# Patient Record
Sex: Male | Born: 1970
Health system: Southern US, Community
[De-identification: ages and names within clinical notes are randomized; demographics above are authoritative.]

## PROBLEM LIST (undated history)

## (undated) DIAGNOSIS — I10 Essential (primary) hypertension: Secondary | ICD-10-CM

## (undated) DIAGNOSIS — G43909 Migraine, unspecified, not intractable, without status migrainosus: Secondary | ICD-10-CM

## (undated) DIAGNOSIS — N289 Disorder of kidney and ureter, unspecified: Secondary | ICD-10-CM

---

## 2020-06-23 ENCOUNTER — Other Ambulatory Visit: Payer: Self-pay

## 2020-06-23 ENCOUNTER — Ambulatory Visit
Admission: EM | Admit: 2020-06-23 | Discharge: 2020-06-23 | Disposition: A | Discharge status: Home / Self Care | Payer: BLUE CROSS/BLUE SHIELD | Attending: Emergency Medicine | Admitting: Emergency Medicine

## 2020-06-23 ENCOUNTER — Ambulatory Visit: Admit: 2020-06-23 | Discharge status: Absent without leave | Payer: Self-pay

## 2020-06-23 DIAGNOSIS — R0981 Nasal congestion: Secondary | ICD-10-CM | POA: Diagnosis not present

## 2020-06-23 MED ORDER — PREDNISONE 10 MG (21) PO TBPK: ORAL_TABLET | Freq: Every day | ORAL | 0 refills | Status: DC

## 2020-06-23 MED ORDER — AMOXICILLIN-POT CLAVULANATE 875-125 MG PO TABS: 1.0000 | ORAL_TABLET | Freq: Two times a day (BID) | ORAL | 0 refills | Status: DC

## 2020-06-23 MED ORDER — FLUTICASONE PROPIONATE 50 MCG/ACT NA SUSP: 1.0000 | Freq: Every day | NASAL | 0 refills | Status: DC

## 2020-06-23 NOTE — ED Provider Notes (Signed)
[facility]    CSN: [id number] Arrival date & time: 06/23/20  0941      History   Chief Complaint Chief Complaint  Patient presents with   Facial Pain    x 3 days    HPI Jack Singleton is a 49 y.o. male.   Jack Singleton presents with complaints of sinus pressure and mild congestion which started a few days ago. No shortness of breath  Or cough. Mild sore throat. States he tends to get a sinus infection every year which feels similar. No fevers. No gi symptoms. No dizziness.    ROS per HPI, negative if not otherwise mentioned.      History reviewed. No pertinent past medical history.  There are no problems to display for this patient.   History reviewed. No pertinent surgical history.     Home Medications    Prior to Admission medications   Medication Sig Start Date End Date Taking? Authorizing Provider  amoxicillin-clavulanate (AUGMENTIN) 875-125 MG tablet Take 1 tablet by mouth every 12 (twelve) hours. 06/27/20   Zigmund Gottron, NP  fluticasone (FLONASE) 50 MCG/ACT nasal spray Place 1 spray into both nostrils daily. 06/23/20   Zigmund Gottron, NP  predniSONE (STERAPRED UNI-PAK 21 TAB) 10 MG (21) TBPK tablet Take by mouth daily. Per box instruction 06/23/20   Zigmund Gottron, NP    Family History History reviewed. No pertinent family history.  Social History Social History   Tobacco Use   Smoking status: Never Smoker   Smokeless tobacco: Never Used  Scientific laboratory technician Use: Never used  Substance Use Topics   Alcohol use: Yes   Drug use: Never     Allergies   Patient has no known allergies.   Review of Systems Review of Systems   Physical Exam Triage Vital Signs ED Triage Vitals  Enc Vitals Group     BP 06/23/20 0952 (!) 185/133     Pulse Rate 06/23/20 0952 82     Resp 06/23/20 0952 17     Temp 06/23/20 0952 98.2 F (36.8 C)     Temp Source 06/23/20 0952 Oral     SpO2 06/23/20 0952 99 %     Weight --       Height --      Head Circumference --      Peak Flow --      Pain Score 06/23/20 0954 4     Pain Loc --      Pain Edu? --      Excl. in [place]? --    No data found.  Updated Vital Signs BP (!) 185/133 (BP Location: Left Arm)    Pulse 82    Temp 98.2 F (36.8 C) (Oral)    Resp 17    SpO2 99%   Visual Acuity Right Eye Distance:   Left Eye Distance:   Bilateral Distance:    Right Eye Near:   Left Eye Near:    Bilateral Near:     Physical Exam Constitutional:      Appearance: He is well-developed.  HENT:     Right Ear: Tympanic membrane and ear canal normal.     Left Ear: Tympanic membrane and ear canal normal.     Nose:     Right Sinus: Maxillary sinus tenderness present. No frontal sinus tenderness.     Left Sinus: Maxillary sinus tenderness present. No frontal sinus tenderness.  Cardiovascular:     Rate and Rhythm: Normal  rate.  Pulmonary:     Effort: Pulmonary effort is normal.  Skin:    General: Skin is warm and dry.  Neurological:     Mental Status: He is alert and oriented to person, place, and time.      UC Treatments / Results  Labs (all labs ordered are listed, but only abnormal results are displayed) Labs Reviewed - No data to display  EKG   Radiology No results found.  Procedures Procedures (including critical care time)  Medications Ordered in UC Medications - No data to display  Initial Impression / Assessment and Plan / UC Course  I have reviewed the triage vital signs and the nursing notes.  Pertinent labs & imaging results that were available during my care of the patient were reviewed by me and considered in my medical decision making (see chart for details).     Sinusitis, <10days. Prednisone provided, as it has provided relief in the past. Have and hold augmentin, to start this weekend if worsening of symptoms. Return precautions provided. Patient verbalized understanding and agreeable to plan.   Final Clinical Impressions(s) / UC  Diagnoses   Final diagnoses:  Sinus congestion     Discharge Instructions     Push fluids to ensure adequate hydration and keep secretions thin.  Tylenol and/or ibuprofen as needed for pain or fevers.  Mucinex as an expectorant or hypertensive safe medications as needed (your blood pressure is elevated today) Course of prednisone as provided.  If by this weekend if no improvement or if worsening may complete antibiotics, but I would wait to take this and not use if any improvement of symptoms as this is likely viral in nature.  If symptoms worsen or do not improve in the next week to return to be seen or to follow up with your PCP- follow up for recheck of your blood pressure.     ED Prescriptions    Medication Sig Dispense Auth. Provider   fluticasone (FLONASE) 50 MCG/ACT nasal spray Place 1 spray into both nostrils daily. 16 g Augusto Gamble B, NP   predniSONE (STERAPRED UNI-PAK 21 TAB) 10 MG (21) TBPK tablet Take by mouth daily. Per box instruction 21 tablet Augusto Gamble B, NP   amoxicillin-clavulanate (AUGMENTIN) 875-125 MG tablet Take 1 tablet by mouth every 12 (twelve) hours. 14 tablet Zigmund Gottron, NP     PDMP not reviewed this encounter.   Zigmund Gottron, NP 06/23/20 1253

## 2020-06-23 NOTE — ED Triage Notes (Signed)
Pt states he has had sinus pain/pressure behind hisa eyes and head. Pt states he has this every year. Pt states no fever of other symptoms besides mild congestion. Pt is aox4 and ambulatory.

## 2020-06-23 NOTE — ED Notes (Signed)
Pt states his BP is always high at the dr so it will still be high and he doesn't want it rechecked.

## 2020-06-23 NOTE — Discharge Instructions (Addendum)
Push fluids to ensure adequate hydration and keep secretions thin.  Tylenol and/or ibuprofen as needed for pain or fevers.  Mucinex as an expectorant or hypertensive safe medications as needed (your blood pressure is elevated today) Course of prednisone as provided.  If by this weekend if no improvement or if worsening may complete antibiotics, but I would wait to take this and not use if any improvement of symptoms as this is likely viral in nature.  If symptoms worsen or do not improve in the next week to return to be seen or to follow up with your PCP- follow up for recheck of your blood pressure.

## 2020-10-09 ENCOUNTER — Emergency Department (HOSPITAL_COMMUNITY): Payer: BLUE CROSS/BLUE SHIELD

## 2020-10-09 ENCOUNTER — Other Ambulatory Visit: Payer: Self-pay

## 2020-10-09 ENCOUNTER — Observation Stay (HOSPITAL_COMMUNITY)
Admission: EM | Admit: 2020-10-09 | Discharge: 2020-10-10 | Disposition: A | Discharge status: Home / Self Care | Payer: BLUE CROSS/BLUE SHIELD | Attending: Student in an Organized Health Care Education/Training Program | Admitting: Student in an Organized Health Care Education/Training Program

## 2020-10-09 ENCOUNTER — Encounter (HOSPITAL_COMMUNITY): Payer: Self-pay

## 2020-10-09 DIAGNOSIS — Z7982 Long term (current) use of aspirin: Secondary | ICD-10-CM | POA: Diagnosis not present

## 2020-10-09 DIAGNOSIS — E669 Obesity, unspecified: Secondary | ICD-10-CM | POA: Diagnosis not present

## 2020-10-09 DIAGNOSIS — I10 Essential (primary) hypertension: Secondary | ICD-10-CM | POA: Diagnosis present

## 2020-10-09 DIAGNOSIS — I159 Secondary hypertension, unspecified: Secondary | ICD-10-CM

## 2020-10-09 DIAGNOSIS — I120 Hypertensive chronic kidney disease with stage 5 chronic kidney disease or end stage renal disease: Principal | ICD-10-CM | POA: Insufficient documentation

## 2020-10-09 DIAGNOSIS — N186 End stage renal disease: Secondary | ICD-10-CM | POA: Insufficient documentation

## 2020-10-09 DIAGNOSIS — Z20822 Contact with and (suspected) exposure to covid-19: Secondary | ICD-10-CM | POA: Insufficient documentation

## 2020-10-09 DIAGNOSIS — Z79899 Other long term (current) drug therapy: Secondary | ICD-10-CM | POA: Diagnosis not present

## 2020-10-09 DIAGNOSIS — N183 Chronic kidney disease, stage 3 unspecified: Secondary | ICD-10-CM | POA: Diagnosis not present

## 2020-10-09 DIAGNOSIS — R531 Weakness: Secondary | ICD-10-CM | POA: Diagnosis present

## 2020-10-09 DIAGNOSIS — G43909 Migraine, unspecified, not intractable, without status migrainosus: Secondary | ICD-10-CM | POA: Diagnosis present

## 2020-10-09 DIAGNOSIS — G43809 Other migraine, not intractable, without status migrainosus: Secondary | ICD-10-CM | POA: Diagnosis not present

## 2020-10-09 HISTORY — DX: Migraine, unspecified, not intractable, without status migrainosus: G43.909

## 2020-10-09 HISTORY — DX: Essential (primary) hypertension: I10

## 2020-10-09 HISTORY — DX: Disorder of kidney and ureter, unspecified: N28.9

## 2020-10-09 LAB — CBC
HCT: 46.6 % (ref 39.0–52.0)
HCT: 45.6 % (ref 39.0–52.0)
Hemoglobin: 15.8 g/dL (ref 13.0–17.0)
Hemoglobin: 15.4 g/dL (ref 13.0–17.0)
MCH: 33.3 pg (ref 26.0–34.0)
MCH: 33.3 pg (ref 26.0–34.0)
MCHC: 33.9 g/dL (ref 30.0–36.0)
MCHC: 33.8 g/dL (ref 30.0–36.0)
MCV: 98.3 fL (ref 80.0–100.0)
MCV: 98.7 fL (ref 80.0–100.0)
Platelets: 384 10*3/uL (ref 150–400)
Platelets: 347 10*3/uL (ref 150–400)
RBC: 4.74 MIL/uL (ref 4.22–5.81)
RBC: 4.62 MIL/uL (ref 4.22–5.81)
RDW: 12 % (ref 11.5–15.5)
RDW: 12.1 % (ref 11.5–15.5)
WBC: 7.6 10*3/uL (ref 4.0–10.5)
WBC: 8.2 10*3/uL (ref 4.0–10.5)
nRBC: 0 % (ref 0.0–0.2)
nRBC: 0 % (ref 0.0–0.2)

## 2020-10-09 LAB — PROTIME-INR
INR: 1 (ref 0.8–1.2)
Prothrombin Time: 12.9 seconds (ref 11.4–15.2)

## 2020-10-09 LAB — BASIC METABOLIC PANEL
Anion gap: 6 (ref 5–15)
BUN: 18 mg/dL (ref 6–20)
CO2: 29 mmol/L (ref 22–32)
Calcium: 9.5 mg/dL (ref 8.9–10.3)
Chloride: 103 mmol/L (ref 98–111)
Creatinine, Ser: 2.01 mg/dL — ABNORMAL HIGH (ref 0.61–1.24)
GFR, Estimated: 40 mL/min — ABNORMAL LOW (ref >60)
Glucose, Bld: 113 mg/dL — ABNORMAL HIGH (ref 70–99)
Potassium: 3.8 mmol/L (ref 3.5–5.1)
Sodium: 138 mmol/L (ref 135–145)

## 2020-10-09 LAB — RESP PANEL BY RT-PCR (FLU A&B, COVID) ARPGX2
Influenza A by PCR: NEGATIVE
Influenza B by PCR: NEGATIVE
SARS Coronavirus 2 by RT PCR: NEGATIVE

## 2020-10-09 LAB — TROPONIN I (HIGH SENSITIVITY)
Troponin I (High Sensitivity): 14 ng/L (ref <18)
Troponin I (High Sensitivity): 13 ng/L (ref <18)

## 2020-10-09 LAB — CREATININE, SERUM
Creatinine, Ser: 1.84 mg/dL — ABNORMAL HIGH (ref 0.61–1.24)
GFR, Estimated: 44 mL/min — ABNORMAL LOW (ref >60)

## 2020-10-09 LAB — HIV ANTIBODY (ROUTINE TESTING W REFLEX): HIV Screen 4th Generation wRfx: NONREACTIVE

## 2020-10-09 MED ORDER — LABETALOL HCL 5 MG/ML IV SOLN
10.0000 mg | Freq: Once | INTRAVENOUS | Status: AC
  Administered 2020-10-09: 10 mg via INTRAVENOUS
  Filled 2020-10-09: qty 4

## 2020-10-09 MED ORDER — SODIUM CHLORIDE 0.9 % IV BOLUS
1000.0000 mL | Freq: Once | INTRAVENOUS | Status: AC
  Administered 2020-10-09: 1000 mL via INTRAVENOUS

## 2020-10-09 MED ORDER — LORAZEPAM 2 MG/ML IJ SOLN
0.5000 mg | Freq: Once | INTRAMUSCULAR | Status: DC
  Filled 2020-10-09: qty 1

## 2020-10-09 MED ORDER — HYDROCHLOROTHIAZIDE 25 MG PO TABS
25.0000 mg | ORAL_TABLET | Freq: Every day | ORAL | Status: DC
  Administered 2020-10-10: 25 mg via ORAL
  Filled 2020-10-09: qty 1

## 2020-10-09 MED ORDER — ACETAMINOPHEN 325 MG PO TABS
650.0000 mg | ORAL_TABLET | Freq: Four times a day (QID) | ORAL | Status: DC | PRN
  Administered 2020-10-09: 650 mg via ORAL
  Filled 2020-10-09: qty 2

## 2020-10-09 MED ORDER — AMLODIPINE BESYLATE 10 MG PO TABS
10.0000 mg | ORAL_TABLET | Freq: Every day | ORAL | Status: DC
  Administered 2020-10-09 – 2020-10-10 (×2): 10 mg via ORAL
  Filled 2020-10-09: qty 2
  Filled 2020-10-09: qty 1

## 2020-10-09 MED ORDER — ENOXAPARIN SODIUM 40 MG/0.4ML ~~LOC~~ SOLN
40.0000 mg | SUBCUTANEOUS | Status: DC
  Administered 2020-10-09: 40 mg via SUBCUTANEOUS
  Filled 2020-10-09: qty 0.4

## 2020-10-09 MED ORDER — AMLODIPINE BESYLATE 5 MG PO TABS
10.0000 mg | ORAL_TABLET | Freq: Once | ORAL | Status: DC
  Filled 2020-10-09: qty 2

## 2020-10-09 NOTE — ED Provider Notes (Signed)
[facility] Provider Note   CSN: [id number] Arrival date & time: 10/09/20  1115     History Chief Complaint  Patient presents with  . Hypertension  . Chest Pain  . Headache    Jack Singleton is a 50 y.o. male.  HPI   Patient with significant medical history of hypertension, end-stage renal disease stage III presents with chief complaint of headaches and right sided arm weakness.  He endorses this started suddenly last night, he states he had some numbness in his hand with a slight headache, he woke around 4 AM this morning with a worsening headache, blurry vision, and worsening numbness and tingling in his right hand.  He denies recent head trauma, is not on anticoagulant, denies history of strokes or cardiac issues.  He states that he has been without his blood pressure medications for a long time and chronically lives in the [id number] to [id number] systolic.  He denies difficulty with word finding, feeling off balance, numbness or weakening in his other 3 extremities.  Patient also endorses that he has been having intermittent chest pain, states is on the left side, does not radiate, not associated with shortness of breath, becoming diaphoretic, nausea, vomiting, not exacerbated by exertion.  He has no cardiac history, no history of PEs or DVTs not on hormone therapy.  Patient denies any alleviating factors.  Patient denies fevers, chills, nasal congestion, abdominal pain, nausea, vomiting, diarrhea, worsening pedal edema.  Past Medical History:  Diagnosis Date  . Headache, migraine   . Hypertension   . Renal disorder     Patient Active Problem List   Diagnosis Date Noted  . Severe hypertension 10/09/2020    History reviewed. No pertinent surgical history.     Family History  Problem Relation Age of Onset  . Heart attack Father   . Hypertension Father     Social History   Tobacco Use  . Smoking status: Never Smoker  . Smokeless tobacco:  Never Used  Vaping Use  . Vaping Use: Never used  Substance Use Topics  . Alcohol use: Yes  . Drug use: Never    Home Medications Prior to Admission medications   Medication Sig Start Date End Date Taking? Authorizing Provider  aspirin EC 81 MG tablet Take 81 mg by mouth in the morning. Swallow whole.   Yes [provider]  eletriptan (RELPAX) 40 MG tablet Take 40 mg by mouth as needed for migraine or headache (and may repeat once in 2 hours, if headache persists or recurs).   Yes [provider]  hydrochlorothiazide (HYDRODIURIL) 25 MG tablet Take 25 mg by mouth daily.   Yes [provider]  Potassium Citrate 15 MEQ (1620 MG) TBCR Take 1 tablet by mouth in the morning.   Yes [provider]  pseudoephedrine (SUDAFED) 30 MG tablet Take 30-60 mg by mouth daily as needed for congestion.   Yes [provider]  amoxicillin-clavulanate (AUGMENTIN) 875-125 MG tablet Take 1 tablet by mouth every 12 (twelve) hours. Patient not taking: No sig reported 06/27/20   Augusto Gamble B, NP  fluticasone (FLONASE) 50 MCG/ACT nasal spray Place 1 spray into both nostrils daily. Patient not taking: No sig reported 06/23/20   Augusto Gamble B, NP  predniSONE (STERAPRED UNI-PAK 21 TAB) 10 MG (21) TBPK tablet Take by mouth daily. Per box instruction Patient not taking: No sig reported 06/23/20   Zigmund Gottron, NP    Allergies    Olmesartan  Review of Systems   Review of Systems  Constitutional: Negative for chills and fever.  HENT: Negative for congestion.   Respiratory: Negative for shortness of breath.   Cardiovascular: Positive for chest pain.  Gastrointestinal: Negative for abdominal pain, diarrhea, nausea and vomiting.  Genitourinary: Negative for enuresis and flank pain.  Musculoskeletal: Positive for neck pain. Negative for back pain.  Skin: Negative for rash.  Neurological: Positive for weakness, light-headedness, numbness and headaches. Negative  for dizziness.  Hematological: Does not bruise/bleed easily.    Physical Exam Updated Vital Signs BP (!) 161/114   Pulse 81   Temp 98.5 F (36.9 C)   Resp 17   Ht '5\' 9"'$  (1.753 m)   Wt 97.1 kg   SpO2 94%   BMI 31.60 kg/m   Physical Exam Vitals and nursing note reviewed.  Constitutional:      General: He is not in acute distress.    Appearance: He is not ill-appearing.  HENT:     Head: Normocephalic and atraumatic.     Nose: No congestion.     Mouth/Throat:     Mouth: Mucous membranes are moist.     Pharynx: Oropharynx is clear. No oropharyngeal exudate or posterior oropharyngeal erythema.  Eyes:     Extraocular Movements: Extraocular movements intact.     Conjunctiva/sclera: Conjunctivae normal.     Pupils: Pupils are equal, round, and reactive to light.  Cardiovascular:     Rate and Rhythm: Normal rate and regular rhythm.     Pulses: Normal pulses.     Heart sounds: No murmur heard. No friction rub. No gallop.   Pulmonary:     Effort: No respiratory distress.     Breath sounds: No wheezing, rhonchi or rales.  Abdominal:     Palpations: Abdomen is soft.     Tenderness: There is no abdominal tenderness.  Musculoskeletal:     Right lower leg: No edema.     Left lower leg: No edema.  Skin:    General: Skin is warm and dry.  Neurological:     Mental Status: He is alert.     GCS: GCS eye subscore is 4. GCS verbal subscore is 5. GCS motor subscore is 6.     Cranial Nerves: No facial asymmetry.     Sensory: Sensory deficit present.     Motor: Weakness and pronator drift present.     Coordination: Finger-Nose-Finger Test normal.     Comments: Cranial nerves II through XII are grossly intact.  Patient is having no difficulty with word finding.  Patient endorses numbness in his right hand with decreased sensitivity in comparison to his left  Patient has noted 4 of his 5 strength in his right upper extremity with positive pronator drift in the right side.   Psychiatric:        Mood and Affect: Mood normal.     ED Results / Procedures / Treatments   Labs (all labs ordered are listed, but only abnormal results are displayed) Labs Reviewed  BASIC METABOLIC PANEL - Abnormal; Notable for the following components:      Result Value   Glucose, Bld 113 (*)    Creatinine, Ser 2.01 (*)    GFR, Estimated 40 (*)    All other components within normal limits  RESP PANEL BY RT-PCR (FLU A&B, COVID) ARPGX2  CBC  PROTIME-INR  HIV ANTIBODY (ROUTINE TESTING W REFLEX)  CBC  CREATININE, SERUM  TROPONIN I (HIGH SENSITIVITY)  TROPONIN I (HIGH SENSITIVITY)  EKG EKG Interpretation  Date/Time:  Friday October 09 2020 11:27:06 EST Ventricular Rate:  93 PR Interval:  152 QRS Duration: 92 QT Interval:  346 QTC Calculation: 430 R Axis:   -9 Text Interpretation: Normal sinus rhythm Normal ECG no prior ECG for comparison. No STEMI Confirmed by Antony Blackbird [phone]) on 10/09/2020 12:58:42 PM   Radiology DG Chest 2 View  Result Date: 10/09/2020 CLINICAL DATA:  Pt c/o chest tightness and SOB x a few days due to his wife being admitted into the hospital this week and him being under stress. EXAM: CHEST - 2 VIEW COMPARISON:  None. FINDINGS: The heart size and mediastinal contours are within normal limits. Both lungs are clear. The visualized skeletal structures are unremarkable. IMPRESSION: No active cardiopulmonary disease. Electronically Signed   By: Kathreen Devoid   On: 10/09/2020 12:11   CT Head Wo Contrast  Result Date: 10/09/2020 CLINICAL DATA:  Headache and dizziness. Blurred vision. Hypertension. EXAM: CT HEAD WITHOUT CONTRAST TECHNIQUE: Contiguous axial images were obtained from the base of the skull through the vertex without intravenous contrast. COMPARISON:  Brain MRI October 09, 2020 FINDINGS: Brain: Ventricles and sulci are normal in size and configuration. There is no intracranial mass, hemorrhage, extra-axial fluid collection, or midline shift.  Minimal decreased attenuation adjacent to the frontal horn of the right lateral ventricle noted. Elsewhere brain parenchyma appears unremarkable. No acute infarct evident. Vascular: No hyperdense vessel. No appreciable vascular calcification. Skull: The bony calvarium appears intact. Sinuses/Orbits: There is mucosal thickening in several anterior ethmoid air cells. Other visualized paranasal sinuses are clear. Orbits appear symmetric bilaterally. Other: Mastoid air cells are clear. IMPRESSION: Minimal periventricular small vessel disease. No acute infarct. No mass or hemorrhage. Mucosal thickening in several anterior ethmoid air cells. Electronically Signed   By: Lowella Grip III M.D.   On: 10/09/2020 15:44   MR ANGIO HEAD WO CONTRAST  Result Date: 10/09/2020 CLINICAL DATA:  Headaches, dizziness, and blurred vision. Uncontrolled hypertension. EXAM: MRI HEAD WITHOUT CONTRAST MRA HEAD WITHOUT CONTRAST TECHNIQUE: Multiplanar, multiecho pulse sequences of the brain and surrounding structures were obtained without intravenous contrast. Angiographic images of the head were obtained using MRA technique without contrast. COMPARISON:  None. FINDINGS: MRI HEAD FINDINGS Brain: There is no evidence of an acute infarct, intracranial hemorrhage, mass, midline shift, or extra-axial fluid collection. The ventricles and sulci are normal. Scattered small foci of T2 hyperintensity in the cerebral white matter bilaterally are nonspecific but compatible with mild chronic small vessel ischemic disease. Vascular: Major intracranial vascular flow voids are preserved. Skull and upper cervical spine: Unremarkable bone marrow signal. Sinuses/Orbits: Unremarkable orbits. Paranasal sinuses and mastoid air cells are clear. Other: None. MRA HEAD FINDINGS The visualized distal vertebral arteries are widely patent to the basilar and codominant. Patent P ICAs are partially visualized bilaterally. Patent SCAs and a right AICA are also  visualized. The left AICA is small and poorly evaluated. The basilar artery is widely patent. Posterior communicating arteries are not clearly identified and may be diminutive or absent. Both PCAs are patent without evidence of a significant proximal stenosis. The internal carotid arteries are widely patent from skull base to carotid termini. ACAs and MCAs are patent without evidence of a proximal branch occlusion or significant proximal stenosis. The left A1 segment is hypoplastic, a normal variant. No aneurysm is identified. IMPRESSION: 1. No acute intracranial abnormality. 2. Mild chronic small vessel ischemic disease. 3. Negative head MRA. Electronically Signed   By: Seymour Bars.D.  On: 10/09/2020 14:59   MR BRAIN WO CONTRAST  Result Date: 10/09/2020 CLINICAL DATA:  Headaches, dizziness, and blurred vision. Uncontrolled hypertension. EXAM: MRI HEAD WITHOUT CONTRAST MRA HEAD WITHOUT CONTRAST TECHNIQUE: Multiplanar, multiecho pulse sequences of the brain and surrounding structures were obtained without intravenous contrast. Angiographic images of the head were obtained using MRA technique without contrast. COMPARISON:  None. FINDINGS: MRI HEAD FINDINGS Brain: There is no evidence of an acute infarct, intracranial hemorrhage, mass, midline shift, or extra-axial fluid collection. The ventricles and sulci are normal. Scattered small foci of T2 hyperintensity in the cerebral white matter bilaterally are nonspecific but compatible with mild chronic small vessel ischemic disease. Vascular: Major intracranial vascular flow voids are preserved. Skull and upper cervical spine: Unremarkable bone marrow signal. Sinuses/Orbits: Unremarkable orbits. Paranasal sinuses and mastoid air cells are clear. Other: None. MRA HEAD FINDINGS The visualized distal vertebral arteries are widely patent to the basilar and codominant. Patent P ICAs are partially visualized bilaterally. Patent SCAs and a right AICA are also visualized.  The left AICA is small and poorly evaluated. The basilar artery is widely patent. Posterior communicating arteries are not clearly identified and may be diminutive or absent. Both PCAs are patent without evidence of a significant proximal stenosis. The internal carotid arteries are widely patent from skull base to carotid termini. ACAs and MCAs are patent without evidence of a proximal branch occlusion or significant proximal stenosis. The left A1 segment is hypoplastic, a normal variant. No aneurysm is identified. IMPRESSION: 1. No acute intracranial abnormality. 2. Mild chronic small vessel ischemic disease. 3. Negative head MRA. Electronically Signed   By: Logan Bores M.D.   On: 10/09/2020 14:59    Procedures Procedures   Medications Ordered in ED Medications  LORazepam (ATIVAN) injection 0.5 mg (0.5 mg Intravenous Not Given 10/09/20 1442)  hydrochlorothiazide (HYDRODIURIL) tablet 25 mg (has no administration in time range)  amLODipine (NORVASC) tablet 10 mg (10 mg Oral Given 10/09/20 1750)  enoxaparin (LOVENOX) injection 40 mg (40 mg Subcutaneous Given 10/09/20 1742)  sodium chloride 0.9 % bolus 1,000 mL (0 mLs Intravenous Stopped 10/09/20 1643)  labetalol (NORMODYNE) injection 10 mg (10 mg Intravenous Given 10/09/20 1540)    ED Course  I have reviewed the triage vital signs and the nursing notes.  Pertinent labs & imaging results that were available during my care of the patient were reviewed by me and considered in my medical decision making (see chart for details).    MDM Rules/Calculators/A&P                         Initial impression-patient presents with headaches, right-sided weakness, chest pain.  He is alert, does not appear acute distress, vital signs notable for hypertension.  Concern for hypertensive emergency versus acute stroke.  Will obtain basic lab work, send patient down for CT head and consult with neurology for further recommendations.  Work-up-CBC negative for acute  findings, BMP shows hyperglycemia 113, creatinine 2.0, First troponin was 14, second 113.  Prothrombin time 12.9, INR 1 MRI of brain negative for acute findings, MRA negative for acute findings chest x-ray negative for acute findings.  EKG sinus without signs of ischemia no ST elevation depression noted.   Consult spoke with dr Curly Shores of neurology, she recommends MRI of brain as well as MRA of head without contrast for further evaluation.  If he has a noted stroke they will come down and consult on him.  She does not  recommend lowering the blood pressure at this time unless it goes above 220.     will admit patient to medicine for uncontrolled hypertension with new neurodeficits.  Spoke with Dr. Lalla Brothers of the hospitalist team, he is except the patient will come down evaluate.   Reassessment patient was updated on lab work and imaging, notified that there is no signs of acute stroke or head bleed noted on imaging, I am concerned patient is suffering from hypertensive urgency causing focal deficit.  And would recommend admission for further evaluation and blood pressure management.  Patient is agreeable to this.  Rule out-I have low suspicion for CVA or intracranial head bleed as MRI as well as MRI of brain negative for acute findings.  Patient does have noted right upper extremity weakness with pronator drift I suspect this is secondary due to hypertensive urgency.  Low suspicion for meningitis as there is no meningeal Noted my exam, no leukocytosis, patient fully vaccinated.  Low suspicion for ACS as patient has negative delta troponin, EKG sinus without signs of ischemia.  Low suspicion for hypertensive crisis as there is no signs of end-stage organ failure, patient does have noted creatinine of 2, there is no prior records to see if this is his baseline or not, per patient states generally his creatinine is around 3.    Plan-admit patient due to uncontrolled hypertension with focal deficits.   Anticipate patient will need further monitoring and trending of blood pressure and discharged it is improving.  Patient to be transferred to admitting team.   Final Clinical Impression(s) / ED Diagnoses Final diagnoses:  Secondary hypertension  Weakness    Rx / DC Orders ED Discharge Orders    None       Marcello Fennel, PA-C 10/09/20 1752    Tegeler, Gwenyth Allegra, MD 10/10/20 1505

## 2020-10-09 NOTE — ED Notes (Signed)
Patient transported to CT 

## 2020-10-09 NOTE — ED Notes (Signed)
The pt has a headache

## 2020-10-09 NOTE — ED Notes (Signed)
Admitting doctors at  The bedside

## 2020-10-09 NOTE — ED Notes (Signed)
UNSUCCESSFUL ATTEMPT TO CALL REPORT

## 2020-10-09 NOTE — H&P (Signed)
Date: 10/09/2020               Patient Name:  Jack Singleton MRN: [id number]  DOB: 1971/04/03 Age / Sex: 50 y.o., male   PCP: Patient, No Pcp Per         Medical Service: Internal Medicine Teaching Service         Attending Physician: Dr. Lalla Brothers, MD    First Contact: Dr. Cato Mulligan, MD Pager: [phone]  Second Contact: Dr. Tamsen Snider, MD Pager: [phone]       After Hours (After 5p/  First Contact Pager: [phone]  weekends / holidays): Second Contact Pager: [phone]   Chief Complaint: Right arm numbness and weakness  History of Present Illness: [name] is a 50 year old man with past medical history significant for HTN, CKD3b, migraines and right shoulder labrum tear with paralabral cyst who presented to [facility] on 10/09/20 for evaluation of headache, right arm weakness and hypertension.  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 Patient had a phone call with his supervisor yesterday afternoon to discuss unrealistic work demands and reports exploding on his boss during the phone call. Later that evening, patient went to sleep without symptoms but awakened at 4:00AM with a severe migraine originating in the posterior aspect of his head which radiated to the top of his head. Patient took one dose of eletriptan (Relpax) which he has at home for management of his migraines, however he has not taken this medication in over two years. Patient's migraine improved, however he noticed associated symptoms of a "numb and aching" pain in his right arm radiating from his right armpit to his right fingertips. He also endorses associated weakness of this extremity. Patient reports going back to sleep and awakening with continued right arm numbness, pain and weakness with improvement of his migraine from a 10/10 in severity to a 2/10 in severity. His wife who works as a Multimedia programmer at [employer] took  his blood pressure at home which was elevated to 160/120. Patient presented to the emergency department for evaluation of his right arm numbness, weakness and pain in the setting of significantly elevated blood pressure.  Of note, patient lives in [place] and receives medical care from his PCP as well as a Dealer who lives in [place] via telehealth appointments. Patient previously prescribed olmesartan in addition to HCTZ, however patient discontinued olmesartan due to hyperkalemia although he was concurrently taking potassium supplementation at the time. Patient has continued to treat his hypertension with HCTZ alone and reports daily adherence to this medication.   ED Course: On arrival to the ED, patient was afebrile, hypertensive to 209/131, heart rate of 97, respirations 18, saturating at 100% on room air. EKG revealed normal sinus rhythm. Initial labs significant for BMP with creatinine of 2.01, BUN 18, GFR 40; CBC unremarkable; troponin 14 then 13; INR 1.0; COVID pending. Initial imaging: CXR, MR Brain, MRA Head, and CT Head negative for acute insult. Patient received '10mg'$  IV labetalol and 1L bolus of normal saline. Patient admitted to IMTS for further evaluation and management.  Medications:  Aspirin '81mg'$  daily Eletriptan '40mg'$  PRN (reports not taking in two years, however patient took one pill last night) HCTZ '25mg'$  daily Potassium citrate 44mq daily Pseudoephedrine 30-'60mg'$  daily PRN for congestion (reports not taking in past several days)  Allergies: Allergies as of 10/09/2020 - Review Complete 10/09/2020  Allergen Reaction  Noted  . Olmesartan Other (See Comments) 10/09/2020   Past Medical History: HTN Migraines Chronic Kidney Disease Stage 3b Right shoulder labrum tear Right shoulder paralabral cyst  Family History: Patient reports strong family history of myocardial infarctions and hypertension on father's side of family. Denies family history of cancer.  Social History:  Patient lives in [place] with his wife. Patient has been in [place] for the past several weeks as his wife works as a traveling Marine scientist and currently employed at [employer]. 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 Patient reports alcohol consumption of approximately one to two drinks per week. Denies tobacco or drug use.  Review of Systems: A complete ROS was negative except as per HPI.  Physical Exam: Blood pressure (!) 161/114, pulse 81, temperature 98.5 F (36.9 C), resp. rate 17, height '5\' 9"'$  (1.753 m), weight 97.1 kg, SpO2 94 %. Physical Exam Vitals and nursing note reviewed.  Constitutional:      General: He is not in acute distress.    Appearance: He is obese. He is not ill-appearing.  HENT:     Head: Normocephalic and atraumatic.  Eyes:     Extraocular Movements: Extraocular movements intact.     Pupils: Pupils are equal, round, and reactive to light.  Cardiovascular:     Rate and Rhythm: Normal rate and regular rhythm.     Heart sounds: Normal heart sounds.  Pulmonary:     Effort: Pulmonary effort is normal. No respiratory distress.     Breath sounds: Normal breath sounds.  Chest:     Chest wall: No tenderness.  Abdominal:     General: Bowel sounds are normal.     Palpations: Abdomen is soft.     Tenderness: There is no abdominal tenderness.  Musculoskeletal:        General: Normal range of motion.     Cervical back: Normal range of motion and neck supple.     Right lower leg: No edema.     Left lower leg: No edema.  Skin:    General: Skin is warm and dry.     Capillary Refill: Capillary refill takes less than 2 seconds.  Neurological:     General: No focal deficit present.     Mental Status: He is alert and oriented to person, place, and time. Mental status is at baseline.     Cranial Nerves: Cranial nerves are intact. No cranial nerve deficit, dysarthria or facial asymmetry.     Sensory: Sensory deficit present.      Motor: Weakness present.     Comments:  Strength: -Right shoulder: 4/5 with internal and external rotation, and abduction -Right forearm: 5/5 with flexion and extension -Right hand: Appropriate grip strength  Sensation: -Mildly diminished sensation to light touch of first, second and third digits of the right hand  Psychiatric:        Mood and Affect: Mood normal.        Behavior: Behavior normal.    EKG: personally reviewed my interpretation is normal sinus rhythm  CXR: personally reviewed my interpretation is no active cardiopulmonary disease.  Assessment & Plan by Problem: Active Problems:   Severe hypertension  Donney Kallio is a 50 year old man with past medical history significant for HTN, CKD3b, migraines and right shoulder labrum tear who presented to [facility] on 10/09/20 for evaluation of headache and right arm numbness and weakness found to have severe hypertension.  #Severe hypertension, improving Patient presented to  [facility] with blood pressure significantly elevated. Patient's evaluation in emergency department for CVA was unremarkable and laboratory workup was unrevealing for any acute abnormality. Patient would benefit from tighter control of his chronic hypertension especially in the setting of his chronic kidney disease as HCTZ '25mg'$  is not adequately controlling this condition at baseline. Prior to arrival patient took dose of eletriptan which may cause increased blood pressure especially in those with kidney disease. Patient's blood pressure noted to increase from [id number] systolic to [id number] systolic while discussing the events of yesterday afternoon related to his job raising suspicion for patient's underlying stress also contributing. Patient reports taking pseudoephedrine for congestion, however he denies taking this medication in the past few days. Patient's severe hypertension on presentation likely multifactorial. Although patient would benefit from starting an ACE-inhibitor or  ARB, will defer from starting this medication in the inpatient setting. Patient's blood pressure most recently improved to 161/114. -Start amlodipine '10mg'$  daily -Continue home HCTZ '25mg'$  daily -If systolic blood pressure 123456, IV labetalol '10mg'$  -Close follow-up with outpatient primary care physician  -Patient traveling back to [place] in the next few days and will be able to attend in-person appointment  #Migraines, chronic Patient has history of migraines for which he previously was prescribed eletriptan. Patient has not had to take this medication for his migraines in over two years, however he took one tablet last night. Patient reports that his headache has improved significantly and that he states that he feels well without the need for additional medication at this time. -Continue to monitor -Acetaminophen '650mg'$  PRN Q6H  #Right arm numbness and weakness, improving #History of right shoulder labrum tear and paralabral cyst Patient has history of right shoulder and arm pain secondary to known labrum tear and paralabral cyst. Patient reports worsened symptoms with the development of numbness radiating from his axilla to his fingertips upon awakening. On physical examination, patient does have weakness and pain with internal and external rotation of the right shoulder consistent with his known labrum injury. Patient also has some mildly decreased sensation of the right fingers consistent with median nerve entrapment. -Acetaminophen '650mg'$  Q6H -Continued follow-up with PCP   #CKD3b, chronic Patient reports that his baseline creatinine for the past several years has been around 3.6 with most recent basic metabolic panel in October. Patient follows closely with primary care provider in [place] and urologist in [place] for management of this chronic condition. Patient's BMP on admission reveals creatinine of 2.01 with GFR of 40 consistent with CKD3b. Discussed with patient the importance of good control  blood control in preventing progression of his CKD. Patient previously took olmesartan, however discontinued due to hyperkalemia however he was concurrently supplementing with potassium. -Discuss with PCP and urologist starting ACE-inhibitor or ARB  #Code status: Full code #Diet: Regular #VTE ppx: Enoxaparin '40mg'$  daily  Dispo: Admit patient to Observation with expected length of stay less than 2 midnights.  Signed: Cato Mulligan, MD 10/09/2020, 6:14 PM  Pager: [phone] After 5pm on weekdays and 1pm on weekends: On Call pager: [phone]

## 2020-10-09 NOTE — ED Triage Notes (Signed)
Pt from out of town, states he is in [place] 3 kidney failure. Reports uncontrolled HTN, intermittent chest pain and constant headaches with associated dizziness and blurred vision. Pt a.o

## 2020-10-09 NOTE — ED Notes (Signed)
PT C/O A SL HEADACHE  TYLENOL GIVEN

## 2020-10-09 NOTE — ED Notes (Signed)
REPORT GIVEN TO KEN RN ON 3W

## 2020-10-09 NOTE — ED Notes (Signed)
Pt to ct 

## 2020-10-09 NOTE — ED Notes (Signed)
The pts headache has been gone  He is talking to his wife on the phone

## 2020-10-09 NOTE — ED Notes (Signed)
Pt still in MRI at this time

## 2020-10-10 DIAGNOSIS — I10 Essential (primary) hypertension: Secondary | ICD-10-CM

## 2020-10-10 DIAGNOSIS — N183 Chronic kidney disease, stage 3 unspecified: Secondary | ICD-10-CM | POA: Diagnosis not present

## 2020-10-10 DIAGNOSIS — E669 Obesity, unspecified: Secondary | ICD-10-CM | POA: Diagnosis not present

## 2020-10-10 DIAGNOSIS — G43909 Migraine, unspecified, not intractable, without status migrainosus: Secondary | ICD-10-CM | POA: Diagnosis present

## 2020-10-10 DIAGNOSIS — Z6831 Body mass index (BMI) 31.0-31.9, adult: Secondary | ICD-10-CM

## 2020-10-10 DIAGNOSIS — G43809 Other migraine, not intractable, without status migrainosus: Secondary | ICD-10-CM | POA: Diagnosis not present

## 2020-10-10 LAB — BASIC METABOLIC PANEL
Anion gap: 6 (ref 5–15)
BUN: 18 mg/dL (ref 6–20)
CO2: 30 mmol/L (ref 22–32)
Calcium: 9 mg/dL (ref 8.9–10.3)
Chloride: 103 mmol/L (ref 98–111)
Creatinine, Ser: 1.9 mg/dL — ABNORMAL HIGH (ref 0.61–1.24)
GFR, Estimated: 42 mL/min — ABNORMAL LOW (ref >60)
Glucose, Bld: 100 mg/dL — ABNORMAL HIGH (ref 70–99)
Potassium: 3.8 mmol/L (ref 3.5–5.1)
Sodium: 139 mmol/L (ref 135–145)

## 2020-10-10 MED ORDER — ASPIRIN-ACETAMINOPHEN-CAFFEINE 250-250-65 MG PO TABS: 1.0000 | ORAL_TABLET | Freq: Once | ORAL | Status: DC

## 2020-10-10 MED ORDER — ASPIRIN-ACETAMINOPHEN-CAFFEINE 250-250-65 MG PO TABS
2.0000 | ORAL_TABLET | Freq: Once | ORAL | Status: AC
  Administered 2020-10-10: 2 via ORAL
  Filled 2020-10-10: qty 2

## 2020-10-10 MED ORDER — AMLODIPINE BESYLATE 10 MG PO TABS: 10.0000 mg | ORAL_TABLET | Freq: Every day | ORAL | 0 refills | Status: AC

## 2020-10-10 NOTE — Discharge Instructions (Signed)
Jack Singleton,  We suggest not taking Relpax again. Discuss other therapies for migraines with your PCP if you continue to experience this. We have prescribed amlodipine for your blood pressure. As we have discussed we recommend discussing other medications with your PCP to slow progression of chronic kidney disease.  Our best.

## 2020-10-10 NOTE — Discharge Summary (Signed)
Name: Jack Singleton MRN: [id number] DOB: 1971/07/06 50 y.o. PCP: Patient, No Pcp Per  Date of Admission: 10/09/2020 11:19 AM Date of Discharge: 10/10/2020 Attending Physician: No att. providers found  Discharge Diagnosis: 1. Principal Problem:   Severe hypertension Active Problems:   Migraine   CKD (chronic kidney disease), stage III ([place])   Obesity  Discharge Medications: Allergies as of 10/10/2020      Reactions   Olmesartan Other (See Comments)   Caused kidney damage      Medication List    STOP taking these medications   amoxicillin-clavulanate 875-125 MG tablet Commonly known as: AUGMENTIN   eletriptan 40 MG tablet Commonly known as: RELPAX   fluticasone 50 MCG/ACT nasal spray Commonly known as: FLONASE   predniSONE 10 MG (21) Tbpk tablet Commonly known as: STERAPRED UNI-PAK 21 TAB   pseudoephedrine 30 MG tablet Commonly known as: SUDAFED     TAKE these medications   amLODipine 10 MG tablet Commonly known as: NORVASC Take 1 tablet (10 mg total) by mouth daily.   aspirin EC 81 MG tablet Take 81 mg by mouth in the morning. Swallow whole.   hydrochlorothiazide 25 MG tablet Commonly known as: HYDRODIURIL Take 25 mg by mouth daily.   Potassium Citrate 15 MEQ (1620 MG) Tbcr Take 1 tablet by mouth in the morning.      Disposition and follow-up:   Mr.Nezar Whittier was discharged from [facility] in Stable condition.  At the hospital follow up visit please address:  1.  Severe hypertension: Patient hospitalized with severe hypertension to 228/128. Patient's severe hypertension determined to be multifactorial in the setting of inadequate treatment at baseline, stress from significant work stressors, and eletriptan administration which worsens hypertension in setting of those with pre-existing HTN and especially those with chronic kidney disease. Patient started on amlodipine '10mg'$  daily, continued on HCTZ '25mg'$  daily and instructed to  discontinue eletriptan and pseudoephedrine use. Patient's blood pressure improved prior to discharge. Please assess patient's adherence and tolerance of amlodipine and HCTZ with cessation of eletriptan and pseudoephedrine. Titrate medications as tolerated.  2.  Labs / imaging needed at time of follow-up: BMP  3.  Pending labs/ test needing follow-up: None  Follow-up Appointments:  Primary care physician in [place] next week Urologist in [place] within 1-2 weeks  Hospital Course by problem list: 1. Severe hypertension: Patient hospitalized with severe hypertension to maximum value of 228/128. Due to associated complaints of headache, and right arm numbness, pain and weakness, patient underwent imaging of the head with CT, MRI and MRA. Neurology discussed the case with ED provider and felt that consultation was not necessary. Patient received '10mg'$  IV labetalol. Patient's severe hypertension determined to be multifactorial in the setting of inadequate treatment at baseline, stress from significant work stressors, and eletriptan administration which worsens hypertension in setting of those with pre-existing HTN and especially those with chronic kidney disease. Patient started on amlodipine '10mg'$  daily, continued on HCTZ '25mg'$  daily and instructed to discontinue eletriptan and pseudoephedrine use. Patient's blood pressure improved to 119/68 overnight. Patient discharged with plan to follow-up closely with PCP in [place] for further management of his chronic hypertension.   Discharge Exam:   BP (!) 166/108 (BP Location: Right Arm)   Pulse 84   Temp 97.9 F (36.6 C) (Oral)   Resp 18   Ht '5\' 9"'$  (1.753 m)   Wt 97.1 kg   SpO2 98%   BMI 31.60 kg/m   Physical Exam Constitutional:  General: He is not in acute distress.    Appearance: He is obese. He is not ill-appearing.  Cardiovascular:     Rate and Rhythm: Normal rate and regular rhythm.  Pulmonary:     Effort: Pulmonary effort is normal. No  tachypnea or respiratory distress.     Breath sounds: Normal breath sounds.  Musculoskeletal:        General: Normal range of motion.  Neurological:     General: No focal deficit present.     Mental Status: He is alert and oriented to person, place, and time.  Psychiatric:        Mood and Affect: Mood normal.        Behavior: Behavior normal.    Pertinent Labs, Studies, and Procedures:  CBC Latest Ref Rng & Units 10/09/2020 10/09/2020  WBC 4.0 - 10.5 K/uL 8.2 7.6  Hemoglobin 13.0 - 17.0 g/dL 15.4 15.8  Hematocrit 39.0 - 52.0 % 45.6 46.6  Platelets 150 - 400 K/uL 347 384   CMP Latest Ref Rng & Units 10/10/2020 10/09/2020 10/09/2020  Glucose 70 - 99 mg/dL 100(H) - 113(H)  BUN 6 - 20 mg/dL 18 - 18  Creatinine 0.61 - 1.24 mg/dL 1.90(H) 1.84(H) 2.01(H)  Sodium 135 - 145 mmol/L 139 - 138  Potassium 3.5 - 5.1 mmol/L 3.8 - 3.8  Chloride 98 - 111 mmol/L 103 - 103  CO2 22 - 32 mmol/L 30 - 29  Calcium 8.9 - 10.3 mg/dL 9.0 - 9.5  DG Chest 2 View  Result Date: 10/09/2020 CLINICAL DATA:  Pt c/o chest tightness and SOB x a few days due to his wife being admitted into the hospital this week and him being under stress. EXAM: CHEST - 2 VIEW COMPARISON:  None. FINDINGS: The heart size and mediastinal contours are within normal limits. Both lungs are clear. The visualized skeletal structures are unremarkable. IMPRESSION: No active cardiopulmonary disease. Electronically Signed   By: Kathreen Devoid   On: 10/09/2020 12:11   CT Head Wo Contrast  Result Date: 10/09/2020 CLINICAL DATA:  Headache and dizziness. Blurred vision. Hypertension. EXAM: CT HEAD WITHOUT CONTRAST TECHNIQUE: Contiguous axial images were obtained from the base of the skull through the vertex without intravenous contrast. COMPARISON:  Brain MRI October 09, 2020 FINDINGS: Brain: Ventricles and sulci are normal in size and configuration. There is no intracranial mass, hemorrhage, extra-axial fluid collection, or midline shift. Minimal decreased  attenuation adjacent to the frontal horn of the right lateral ventricle noted. Elsewhere brain parenchyma appears unremarkable. No acute infarct evident. Vascular: No hyperdense vessel. No appreciable vascular calcification. Skull: The bony calvarium appears intact. Sinuses/Orbits: There is mucosal thickening in several anterior ethmoid air cells. Other visualized paranasal sinuses are clear. Orbits appear symmetric bilaterally. Other: Mastoid air cells are clear. IMPRESSION: Minimal periventricular small vessel disease. No acute infarct. No mass or hemorrhage. Mucosal thickening in several anterior ethmoid air cells. Electronically Signed   By: Lowella Grip III M.D.   On: 10/09/2020 15:44   MR ANGIO HEAD WO CONTRAST  Result Date: 10/09/2020 CLINICAL DATA:  Headaches, dizziness, and blurred vision. Uncontrolled hypertension. EXAM: MRI HEAD WITHOUT CONTRAST MRA HEAD WITHOUT CONTRAST TECHNIQUE: Multiplanar, multiecho pulse sequences of the brain and surrounding structures were obtained without intravenous contrast. Angiographic images of the head were obtained using MRA technique without contrast. COMPARISON:  None. FINDINGS: MRI HEAD FINDINGS Brain: There is no evidence of an acute infarct, intracranial hemorrhage, mass, midline shift, or extra-axial fluid collection. The ventricles and sulci  are normal. Scattered small foci of T2 hyperintensity in the cerebral white matter bilaterally are nonspecific but compatible with mild chronic small vessel ischemic disease. Vascular: Major intracranial vascular flow voids are preserved. Skull and upper cervical spine: Unremarkable bone marrow signal. Sinuses/Orbits: Unremarkable orbits. Paranasal sinuses and mastoid air cells are clear. Other: None. MRA HEAD FINDINGS The visualized distal vertebral arteries are widely patent to the basilar and codominant. Patent P ICAs are partially visualized bilaterally. Patent SCAs and a right AICA are also visualized. The left  AICA is small and poorly evaluated. The basilar artery is widely patent. Posterior communicating arteries are not clearly identified and may be diminutive or absent. Both PCAs are patent without evidence of a significant proximal stenosis. The internal carotid arteries are widely patent from skull base to carotid termini. ACAs and MCAs are patent without evidence of a proximal branch occlusion or significant proximal stenosis. The left A1 segment is hypoplastic, a normal variant. No aneurysm is identified. IMPRESSION: 1. No acute intracranial abnormality. 2. Mild chronic small vessel ischemic disease. 3. Negative head MRA. Electronically Signed   By: Logan Bores M.D.   On: 10/09/2020 14:59   MR BRAIN WO CONTRAST  Result Date: 10/09/2020 CLINICAL DATA:  Headaches, dizziness, and blurred vision. Uncontrolled hypertension. EXAM: MRI HEAD WITHOUT CONTRAST MRA HEAD WITHOUT CONTRAST TECHNIQUE: Multiplanar, multiecho pulse sequences of the brain and surrounding structures were obtained without intravenous contrast. Angiographic images of the head were obtained using MRA technique without contrast. COMPARISON:  None. FINDINGS: MRI HEAD FINDINGS Brain: There is no evidence of an acute infarct, intracranial hemorrhage, mass, midline shift, or extra-axial fluid collection. The ventricles and sulci are normal. Scattered small foci of T2 hyperintensity in the cerebral white matter bilaterally are nonspecific but compatible with mild chronic small vessel ischemic disease. Vascular: Major intracranial vascular flow voids are preserved. Skull and upper cervical spine: Unremarkable bone marrow signal. Sinuses/Orbits: Unremarkable orbits. Paranasal sinuses and mastoid air cells are clear. Other: None. MRA HEAD FINDINGS The visualized distal vertebral arteries are widely patent to the basilar and codominant. Patent P ICAs are partially visualized bilaterally. Patent SCAs and a right AICA are also visualized. The left AICA is  small and poorly evaluated. The basilar artery is widely patent. Posterior communicating arteries are not clearly identified and may be diminutive or absent. Both PCAs are patent without evidence of a significant proximal stenosis. The internal carotid arteries are widely patent from skull base to carotid termini. ACAs and MCAs are patent without evidence of a proximal branch occlusion or significant proximal stenosis. The left A1 segment is hypoplastic, a normal variant. No aneurysm is identified. IMPRESSION: 1. No acute intracranial abnormality. 2. Mild chronic small vessel ischemic disease. 3. Negative head MRA. Electronically Signed   By: Logan Bores M.D.   On: 10/09/2020 14:59   Discharge Instructions: Discharge Instructions    Diet - low sodium heart healthy   Complete by: As directed    Increase activity slowly   Complete by: As directed      Signed: Cato Mulligan, MD 10/10/2020, 12:20 PM   Pager: [phone]

## 2020-10-10 NOTE — Plan of Care (Signed)

## 2020-10-28 ENCOUNTER — Telehealth: Payer: Self-pay | Admitting: Internal Medicine

## 2020-10-31 ENCOUNTER — Other Ambulatory Visit: Payer: Self-pay | Admitting: Internal Medicine

## 2020-11-11 NOTE — Telephone Encounter (Signed)
Work excuse written and provided to clinic.

## 2021-11-01 IMAGING — MR MR HEAD W/O CM
9 of 11 series · 32 of 48 positions shown · non-contrast
Comparison: None.

CLINICAL DATA: Headaches, dizziness, and blurred vision.
Uncontrolled hypertension.

EXAM:
MRI HEAD WITHOUT CONTRAST
MRA HEAD WITHOUT CONTRAST
TECHNIQUE: Multiplanar, multiecho pulse sequences of the brain and surrounding
structures were obtained without intravenous contrast. Angiographic
images of the head were obtained using MRA technique without
contrast.

[Series 3: DWI · axial · 3.0mm · 1.09mm/px · z∈[-99,+44]mm · 7 of 98 slices shown (1 of 4)]
[im 1/98]
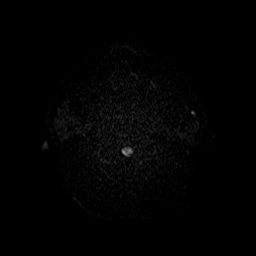
[im 17/98]
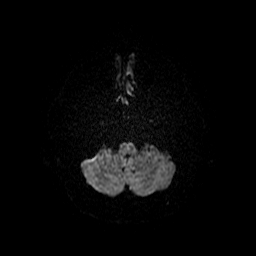
[im 33/98]
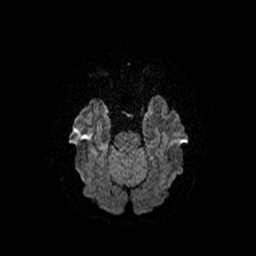
[im 49/98]
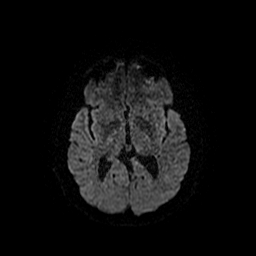
[im 65/98]
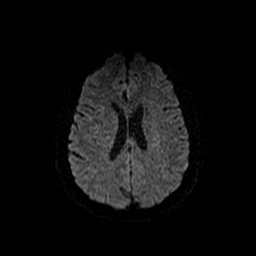
[im 81/98]
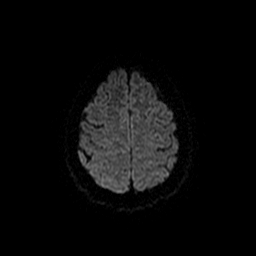
[im 98/98]
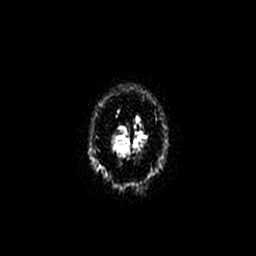

[Series 5: DWI · coronal · 5.0mm · 1.09mm/px · 6 of 72 slices shown (2 of 4)]
[im 1/72]
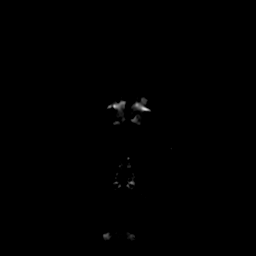
[im 15/72]
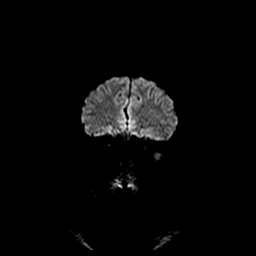
[im 29/72]
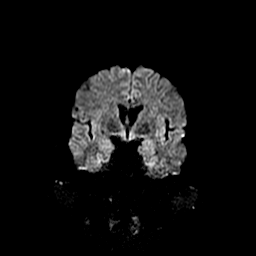
[im 43/72]
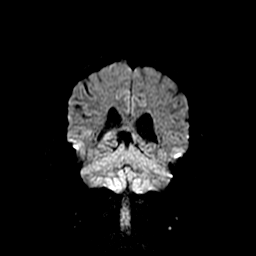
[im 57/72]
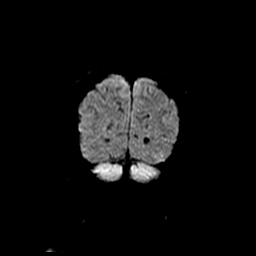
[im 72/72]
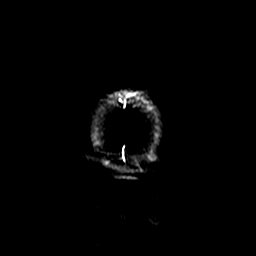

[Series 6: T1 · sagittal · 5.0mm · 0.47mm/px · 2 of 24 slices shown (1 of 2)]
[im 1/24]
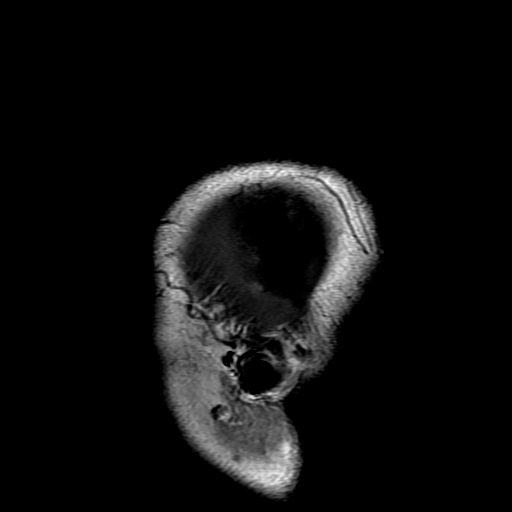
[im 24/24]
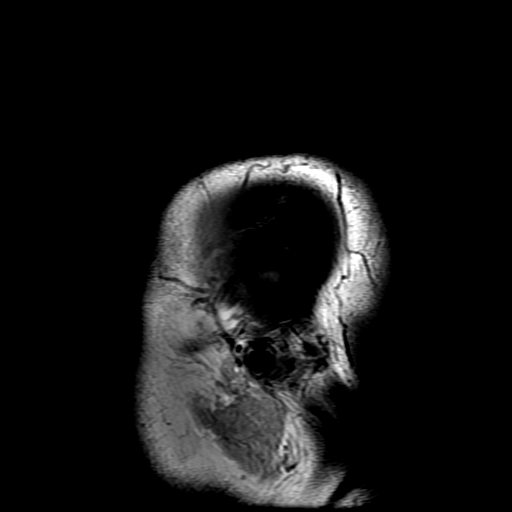

[Series 7: T2 · axial · 5.0mm · 0.43mm/px · z∈[-96,+53]mm · 2 of 26 slices shown (1 of 2)]
[im 1/26]
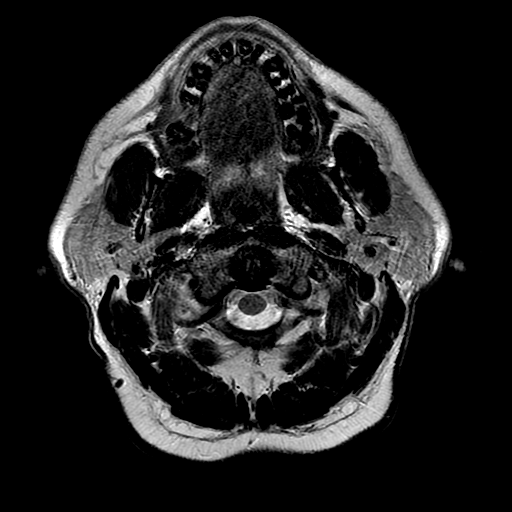
[im 26/26]
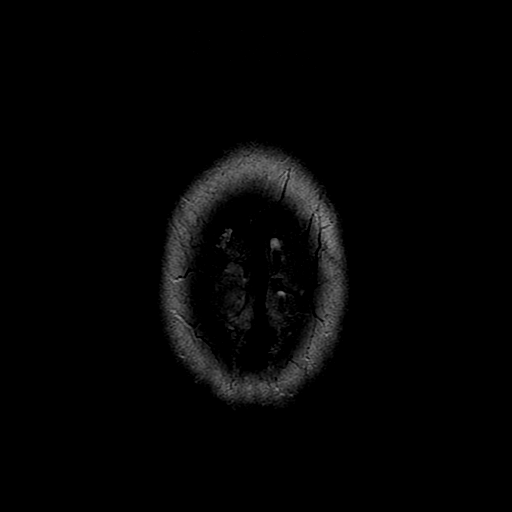

[Series 8: FLAIR · axial · 3.0mm · 0.43mm/px · z∈[-96,+53]mm · 2 of 26 slices shown]
[im 1/26]
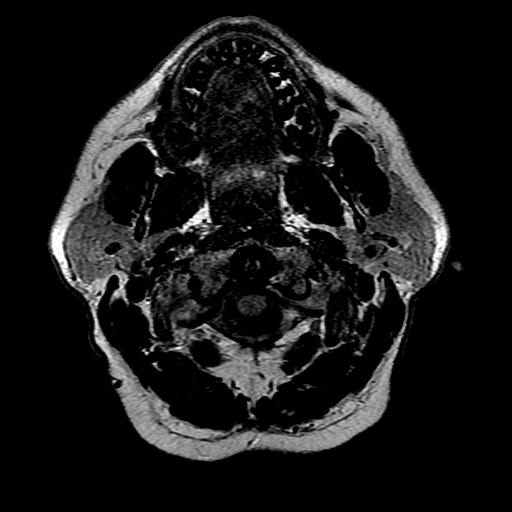
[im 26/26]
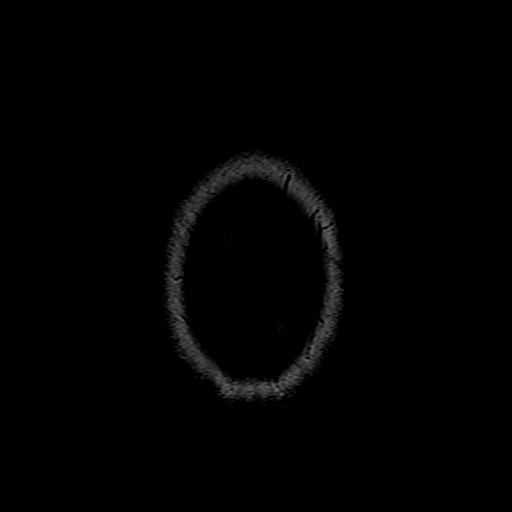

[Series 10: T1 · axial · 3.0mm · 0.47mm/px · z∈[-74,-11]mm · 4 of 100 slices shown (2 of 2)]
[im 1/100]
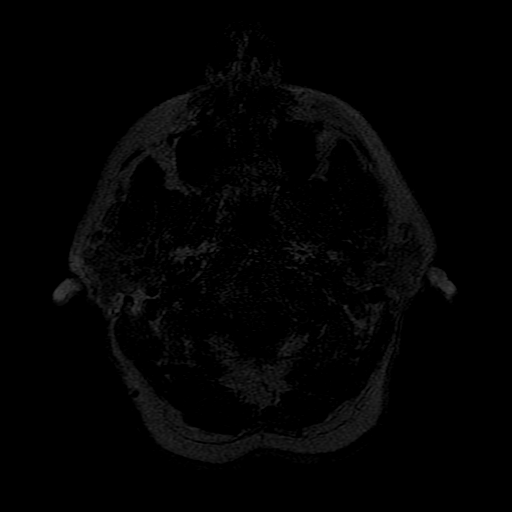
[im 15/100]
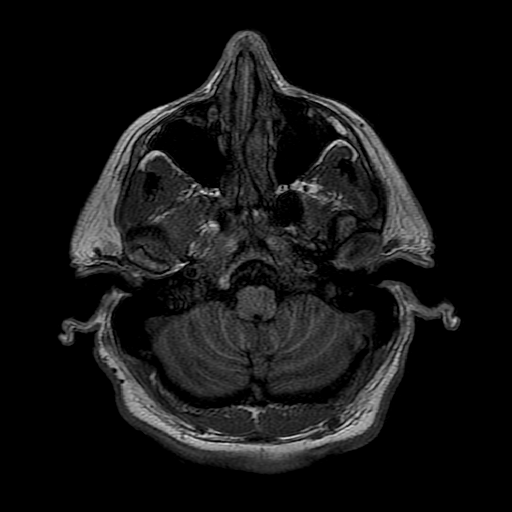
[im 29/100]
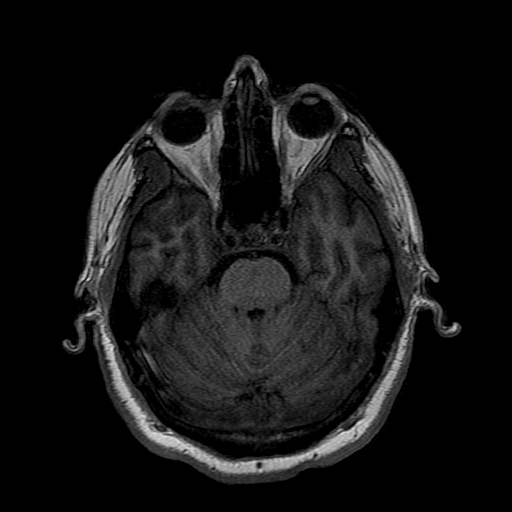
[im 43/100]
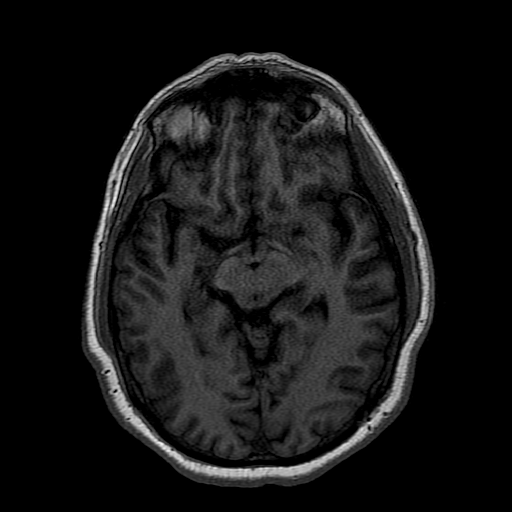

[Series 12: T2 · coronal · 5.0mm · 0.39mm/px · 2 of 28 slices shown (2 of 2)]
[im 1/28]
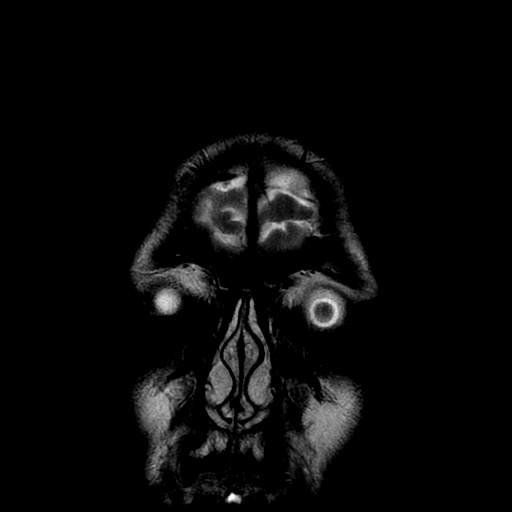
[im 28/28]
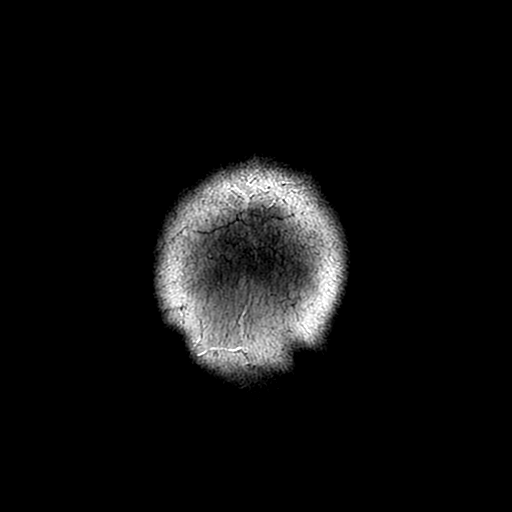

[Series 300: DWI · axial · 3.0mm · 1.09mm/px · z∈[-99,+44]mm · 4 of 49 slices shown (3 of 4)]
[im 1/49]
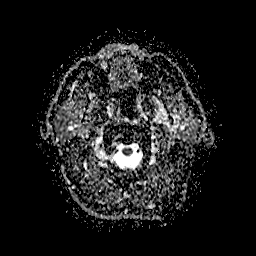
[im 17/49]
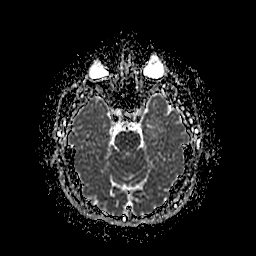
[im 33/49]
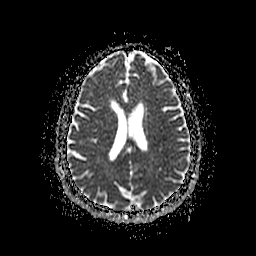
[im 49/49]
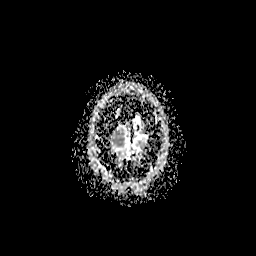

[Series 500: DWI · coronal · 5.0mm · 1.09mm/px · 3 of 36 slices shown (4 of 4)]
[im 1/36]
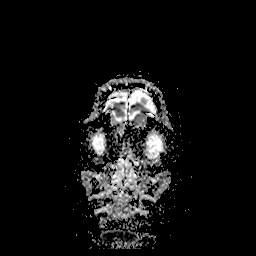
[im 18/36]
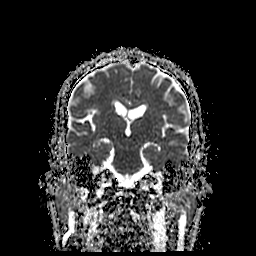
[im 36/36]
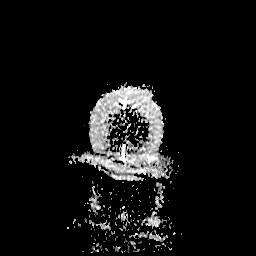

[32 of 48 positions shown; findings below may reference images not displayed]

FINDINGS: MRI HEAD FINDINGS

Brain: There is no evidence of an acute infarct, intracranial
hemorrhage, mass, midline shift, or extra-axial fluid collection.
The ventricles and sulci are normal. Scattered small foci of T2
hyperintensity in the cerebral white matter bilaterally are
nonspecific but compatible with mild chronic small vessel ischemic
disease.

Vascular: Major intracranial vascular flow voids are preserved.

Skull and upper cervical spine: Unremarkable bone marrow signal.

Sinuses/Orbits: Unremarkable orbits. Paranasal sinuses and mastoid
air cells are clear.

Other: None.

MRA HEAD FINDINGS

The visualized distal vertebral arteries are widely patent to the
basilar and codominant. Patent P ICAs are partially visualized
bilaterally. Patent SCAs and a right AICA are also visualized. The
left AICA is small and poorly evaluated. The basilar artery is
widely patent. Posterior communicating arteries are not clearly
identified and may be diminutive or absent. Both PCAs are patent
without evidence of a significant proximal stenosis.

The internal carotid arteries are widely patent from skull base to
carotid termini. ACAs and MCAs are patent without evidence of a
proximal branch occlusion or significant proximal stenosis. The left
A1 segment is hypoplastic, a normal variant. No aneurysm is
identified.
IMPRESSION: 1. No acute intracranial abnormality.
2. Mild chronic small vessel ischemic disease.
3. Negative head MRA.
